# Patient Record
Sex: Female | Born: 1965 | Race: White | Hispanic: No | Marital: Married | Smoking: Never smoker
Health system: Southern US, Community
[De-identification: ages and names within clinical notes are randomized; demographics above are authoritative.]

## PROBLEM LIST (undated history)

## (undated) DIAGNOSIS — I272 Pulmonary hypertension, unspecified: Secondary | ICD-10-CM

## (undated) DIAGNOSIS — R000 Tachycardia, unspecified: Secondary | ICD-10-CM

## (undated) DIAGNOSIS — R9431 Abnormal electrocardiogram [ECG] [EKG]: Secondary | ICD-10-CM

## (undated) DIAGNOSIS — I872 Venous insufficiency (chronic) (peripheral): Secondary | ICD-10-CM

## (undated) HISTORY — DX: Tachycardia, unspecified: R00.0

## (undated) HISTORY — DX: Venous insufficiency (chronic) (peripheral): I87.2

## (undated) HISTORY — DX: Pulmonary hypertension, unspecified: I27.20

## (undated) HISTORY — DX: Abnormal electrocardiogram (ECG) (EKG): R94.31

---

## 2000-06-21 ENCOUNTER — Ambulatory Visit (HOSPITAL_COMMUNITY): Admission: RE | Admit: 2000-06-21 | Discharge: 2000-06-21 | Payer: Self-pay | Admitting: *Deleted

## 2000-06-21 ENCOUNTER — Encounter: Payer: Self-pay | Admitting: Family Medicine

## 2001-11-21 ENCOUNTER — Encounter: Payer: Self-pay | Admitting: Family Medicine

## 2001-11-21 ENCOUNTER — Encounter: Admission: RE | Admit: 2001-11-21 | Discharge: 2001-11-21 | Payer: Self-pay | Admitting: Family Medicine

## 2008-09-17 ENCOUNTER — Other Ambulatory Visit: Admission: RE | Admit: 2008-09-17 | Discharge: 2008-09-17 | Payer: Self-pay | Admitting: Internal Medicine

## 2008-09-24 ENCOUNTER — Encounter: Admission: RE | Admit: 2008-09-24 | Discharge: 2008-09-24 | Payer: Self-pay | Admitting: Internal Medicine

## 2009-03-23 ENCOUNTER — Encounter: Admission: RE | Admit: 2009-03-23 | Discharge: 2009-03-23 | Payer: Self-pay | Admitting: Internal Medicine

## 2010-03-23 ENCOUNTER — Encounter: Admission: RE | Admit: 2010-03-23 | Discharge: 2010-03-23 | Payer: Self-pay | Admitting: Internal Medicine

## 2012-09-17 ENCOUNTER — Other Ambulatory Visit: Payer: Self-pay | Admitting: Internal Medicine

## 2012-09-17 DIAGNOSIS — E041 Nontoxic single thyroid nodule: Secondary | ICD-10-CM

## 2012-10-13 ENCOUNTER — Ambulatory Visit
Admission: RE | Admit: 2012-10-13 | Discharge: 2012-10-13 | Disposition: A | Discharge status: Home / Self Care | Payer: BLUE CROSS/BLUE SHIELD | Source: Ambulatory Visit | Attending: Internal Medicine | Admitting: Internal Medicine

## 2012-10-13 DIAGNOSIS — E041 Nontoxic single thyroid nodule: Secondary | ICD-10-CM

## 2013-09-25 ENCOUNTER — Other Ambulatory Visit: Payer: Self-pay | Admitting: Internal Medicine

## 2013-09-25 DIAGNOSIS — E041 Nontoxic single thyroid nodule: Secondary | ICD-10-CM

## 2013-10-23 ENCOUNTER — Ambulatory Visit
Admission: RE | Admit: 2013-10-23 | Discharge: 2013-10-23 | Disposition: A | Discharge status: Home / Self Care | Payer: BC Managed Care – PPO | Source: Ambulatory Visit | Attending: Internal Medicine | Admitting: Internal Medicine

## 2013-10-23 DIAGNOSIS — E041 Nontoxic single thyroid nodule: Secondary | ICD-10-CM

## 2015-11-09 ENCOUNTER — Other Ambulatory Visit: Payer: Self-pay | Admitting: Internal Medicine

## 2015-11-09 DIAGNOSIS — E041 Nontoxic single thyroid nodule: Secondary | ICD-10-CM

## 2016-01-31 ENCOUNTER — Ambulatory Visit
Admission: RE | Admit: 2016-01-31 | Discharge: 2016-01-31 | Disposition: A | Discharge status: Home / Self Care | Payer: BLUE CROSS/BLUE SHIELD | Source: Ambulatory Visit | Attending: Internal Medicine | Admitting: Internal Medicine

## 2016-01-31 DIAGNOSIS — E041 Nontoxic single thyroid nodule: Secondary | ICD-10-CM

## 2018-01-27 ENCOUNTER — Other Ambulatory Visit: Payer: Self-pay | Admitting: Internal Medicine

## 2018-01-27 DIAGNOSIS — E041 Nontoxic single thyroid nodule: Secondary | ICD-10-CM

## 2018-02-26 ENCOUNTER — Ambulatory Visit
Admission: RE | Admit: 2018-02-26 | Discharge: 2018-02-26 | Disposition: A | Discharge status: Home / Self Care | Payer: BLUE CROSS/BLUE SHIELD | Source: Ambulatory Visit | Attending: Internal Medicine | Admitting: Internal Medicine

## 2018-02-26 DIAGNOSIS — E041 Nontoxic single thyroid nodule: Secondary | ICD-10-CM

## 2018-12-03 ENCOUNTER — Ambulatory Visit: Payer: Self-pay | Admitting: Cardiology

## 2018-12-10 ENCOUNTER — Telehealth: Payer: Self-pay

## 2018-12-10 NOTE — Telephone Encounter (Signed)
Sorry to hear that. I do not know anyone personally.  Thanks MJP

## 2018-12-11 ENCOUNTER — Telehealth: Payer: Self-pay

## 2019-06-05 IMAGING — US US THYROID
2 series · 13 of 25 positions shown · non-contrast
Comparison: 01/31/2016 and 10/13/2012

CLINICAL DATA: Follow-up thyroid nodules.

EXAM:
THYROID ULTRASOUND
TECHNIQUE: Ultrasound examination of the thyroid gland and adjacent soft
tissues was performed.

[Series 1: us thyroid · 0.06mm/px · 53 acquisitions, 12 frames shown (1 of 2)]
[im 1/53]
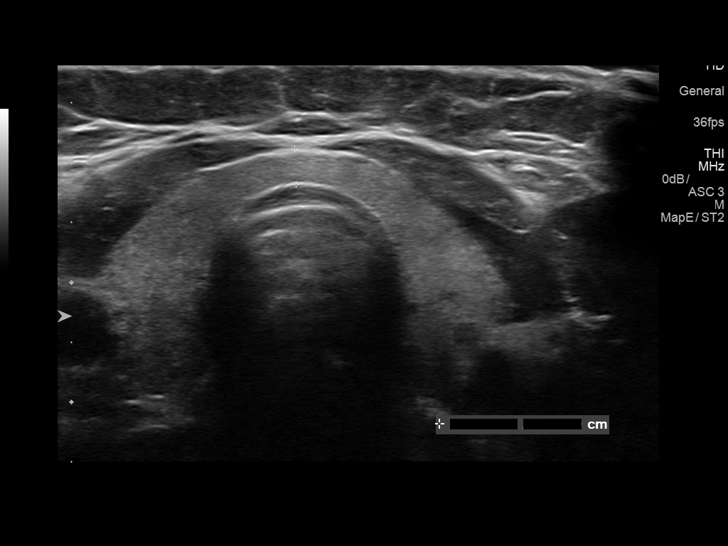
[im 5/53]
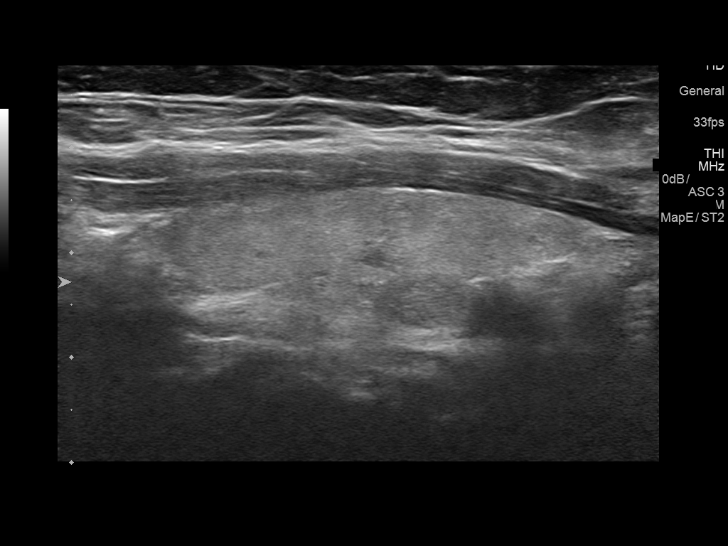
[im 10/53]
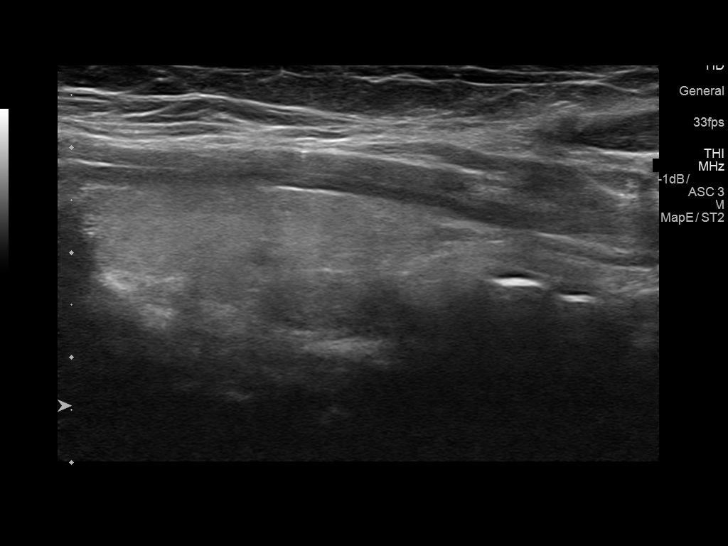
[im 15/53]
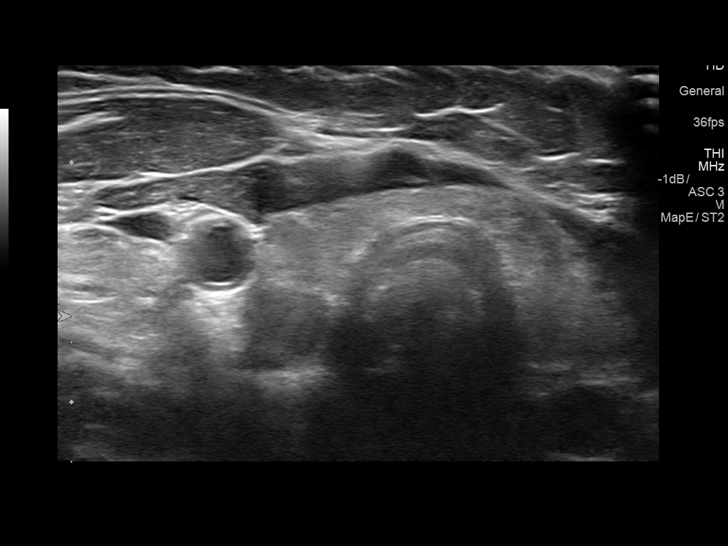
[im 19/53]
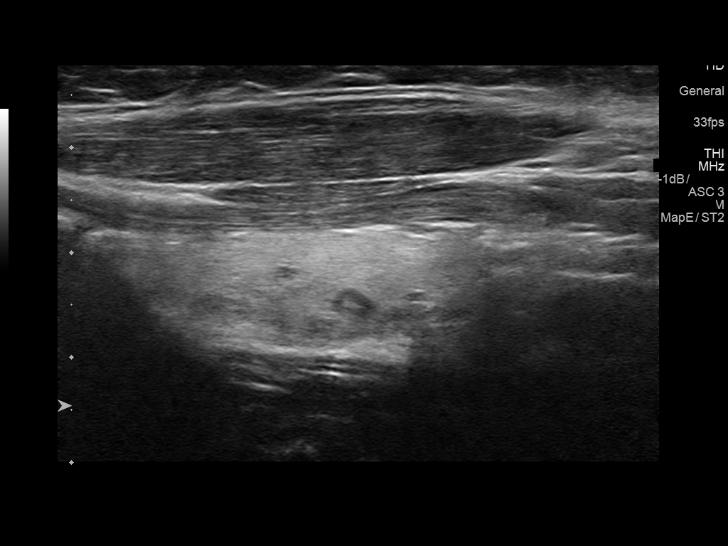
[im 24/53]
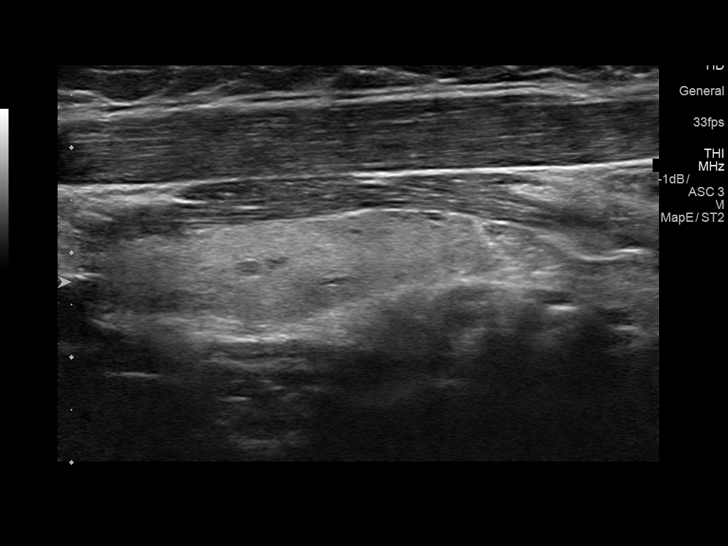
[im 29/53]
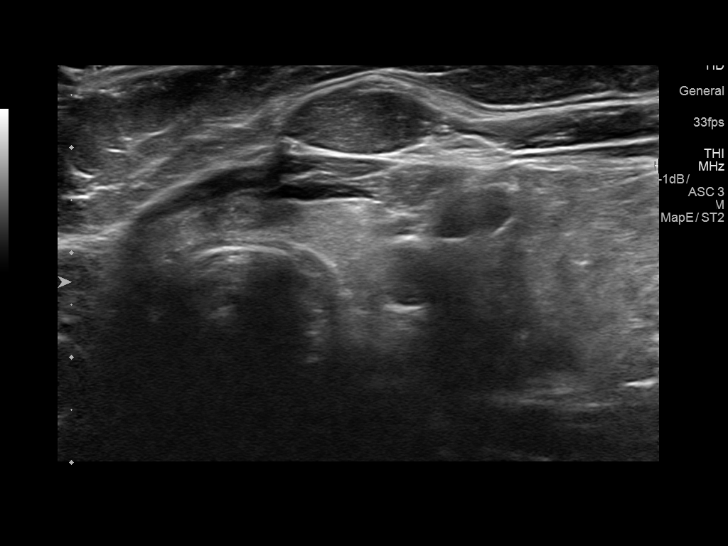
[im 34/53]
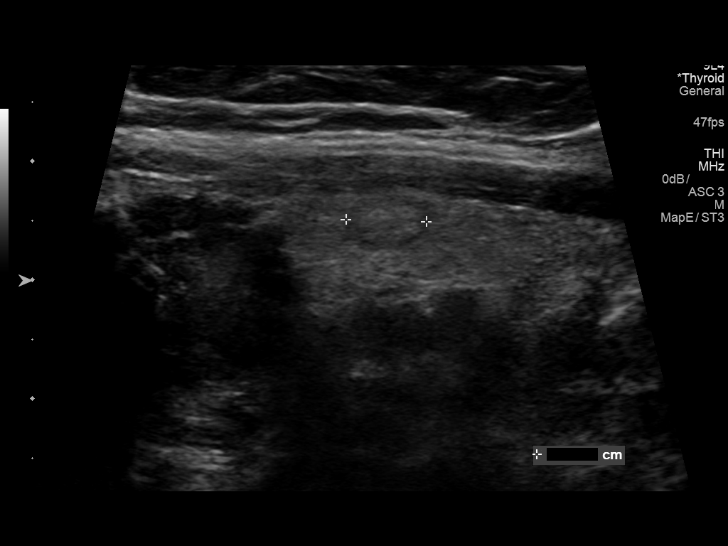
[im 38/53]
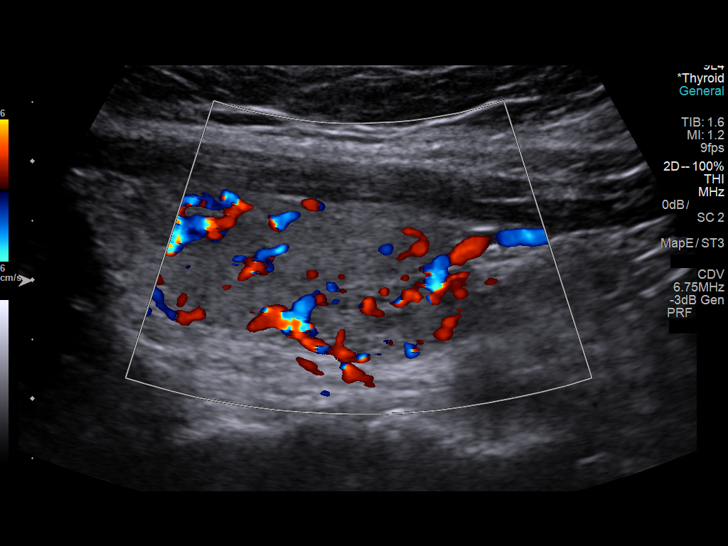
[im 43/53]
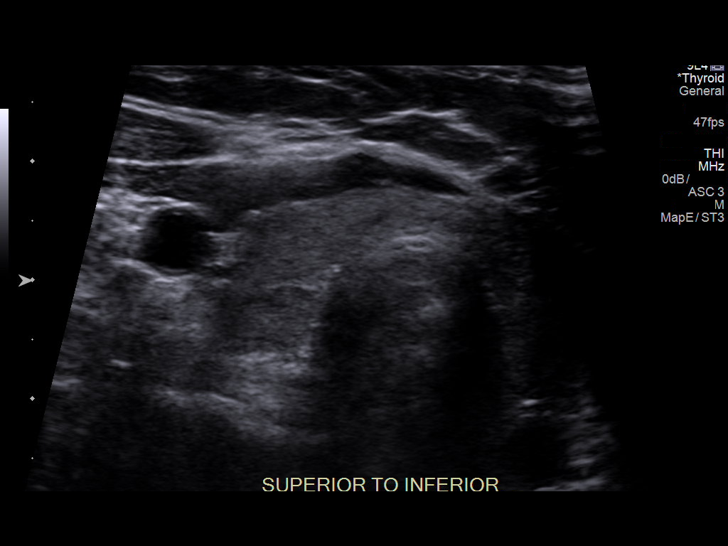
[im 48/53]
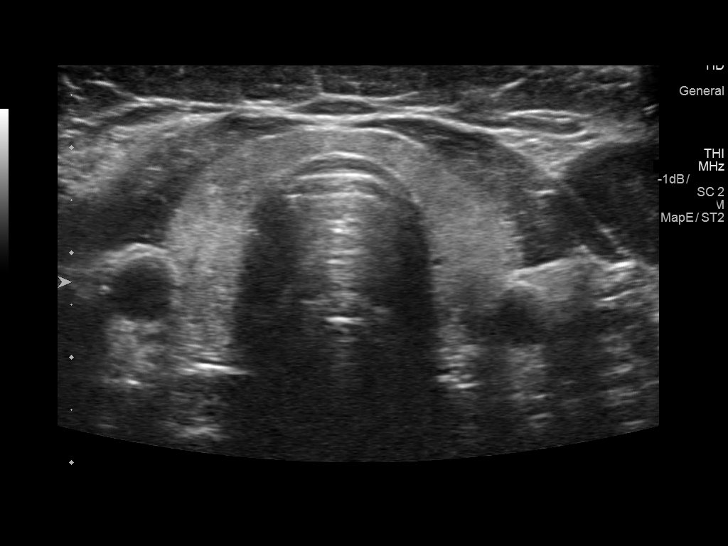
[im 53/53]
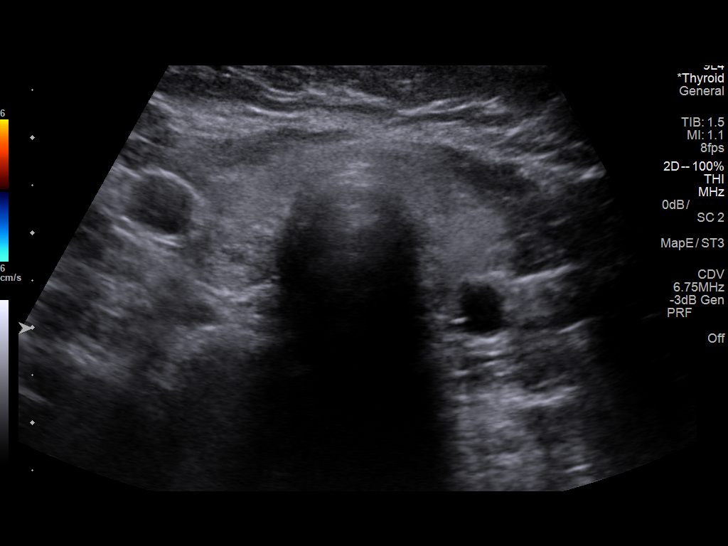

[Series 2001: us thyroid · 0.07mm/px · 1 of 5 slices shown (2 of 2)]
[im 5/5]
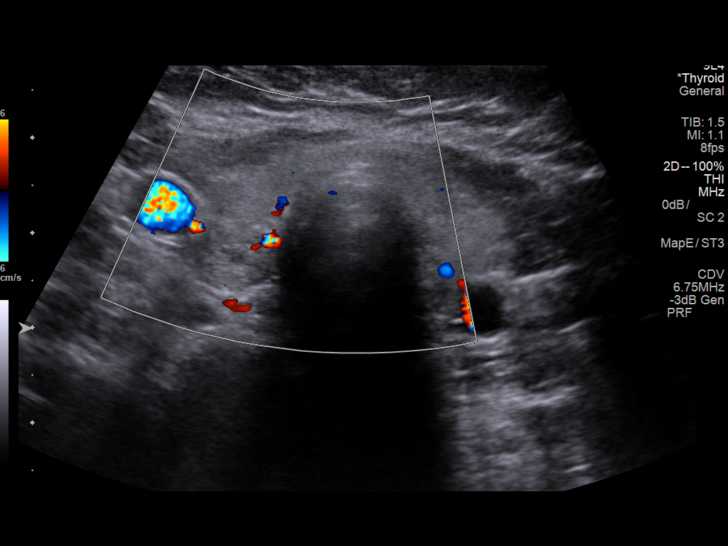

[13 of 25 positions shown; findings below may reference images not displayed]

FINDINGS: Parenchymal Echotexture: Mildly heterogenous

Isthmus: 0.3 cm, previously 0.3 cm

Right lobe: 4.2 x 1.2 x 1.0 cm, previously 4.2 x 1.4 x 1.2 cm

Left lobe: 3.8 x 1.1 x 1.6 cm, previously 4.4 x 1.1 x 1.2 cm

_________________________________________________________

Estimated total number of nodules >/= 1 cm: 1

Number of spongiform nodules >/=  2 cm not described below (TR1): 0

Number of mixed cystic and solid nodules >/= 1.5 cm not described
below (TR2): 0

_________________________________________________________

Nodule # 1:

Prior biopsy: No

Location: Right; Mid; Medial

Maximum size: 0.7 cm; Other 2 dimensions: 0.5 x 0.6 cm, previously,
0.9 x 0.5 x 0.6 cm in 4547. This nodule measured 0.9 x 0.6 x 0.6 cm
on 10/13/2012.

Composition: solid/almost completely solid (2)

Echogenicity: isoechoic (1)

Shape: not taller-than-wide (0)

Margins: ill-defined (0)

Echogenic foci: none (0)

ACR TI-RADS total points: 3.

ACR TI-RADS risk category:  TR3 (3 points).

Significant change in size (>/= 20% in two dimensions and minimal
increase of 2 mm): No

Change in features: No

Change in ACR TI-RADS risk category: No

ACR TI-RADS recommendations:

Given size (<1.4 cm) and appearance, this nodule does NOT meet
TI-RADS criteria for biopsy or dedicated follow-up. This is
compatible with a benign nodule based on the stability since 1895.

_________________________________________________________

There is a subtle nodule versus pseudo nodule along the inferior
posterior aspect of the right thyroid lobe. This nodular area is
mildly hypoechoic and measures 1.5 x 0.6 x 0.8 cm previously
measured 1.3 x 0.7 x 0.8 cm in 4547. This nodular area measured
x 0.6 x 0.8 cm in 1895.

0.4 cm hypoechoic nodule in mid left thyroid lobe that does not meet
criteria for biopsy or dedicated follow-up.
IMPRESSION: No significant change in the thyroid nodules. In fact, the dominant
nodules have been stable since 1895 and most compatible with benign
nodules.

The above is in keeping with the ACR TI-RADS recommendations - [HOSPITAL] 4547;[DATE].
